# Patient Record
Sex: Male | Born: 1955
Health system: Southern US, Community
[De-identification: ages and names within clinical notes are randomized; demographics above are authoritative.]

## PROBLEM LIST (undated history)

## (undated) DIAGNOSIS — F1010 Alcohol abuse, uncomplicated: Secondary | ICD-10-CM

---

## 2001-09-17 ENCOUNTER — Emergency Department (HOSPITAL_COMMUNITY): Admission: EM | Admit: 2001-09-17 | Discharge: 2001-09-17 | Payer: Self-pay | Admitting: Emergency Medicine

## 2001-09-24 ENCOUNTER — Emergency Department (HOSPITAL_COMMUNITY): Admission: EM | Admit: 2001-09-24 | Discharge: 2001-09-24 | Payer: Self-pay | Admitting: Emergency Medicine

## 2002-10-31 ENCOUNTER — Emergency Department (HOSPITAL_COMMUNITY): Admission: EM | Admit: 2002-10-31 | Discharge: 2002-11-01 | Payer: Self-pay | Admitting: Emergency Medicine

## 2008-06-07 ENCOUNTER — Inpatient Hospital Stay (HOSPITAL_COMMUNITY): Admission: EM | Admit: 2008-06-07 | Discharge: 2008-06-08 | Payer: Self-pay | Admitting: Emergency Medicine
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
## 2011-09-01 LAB — POCT I-STAT, CHEM 8
BUN: 10
Chloride: 104
Creatinine, Ser: 1.3
Potassium: 3.9
Sodium: 141

## 2011-09-01 LAB — BASIC METABOLIC PANEL
CO2: 22
Chloride: 104
GFR calc non Af Amer: >60
Glucose, Bld: 100 — ABNORMAL HIGH
Potassium: 3.6
Sodium: 135

## 2011-09-01 LAB — CBC
HCT: 37.6 — ABNORMAL LOW
Hemoglobin: 12.6 — ABNORMAL LOW
MCV: 97.5
RDW: 13.3

## 2011-09-01 LAB — RAPID URINE DRUG SCREEN, HOSP PERFORMED
Barbiturates: NOT DETECTED
Benzodiazepines: NOT DETECTED
Cocaine: NOT DETECTED
Opiates: NOT DETECTED

## 2011-09-01 LAB — ETHANOL: Alcohol, Ethyl (B): 271 — ABNORMAL HIGH

## 2011-09-01 LAB — ABO/RH: ABO/RH(D): O POS

## 2011-09-01 LAB — PROTIME-INR: INR: 0.9

## 2013-08-28 DIAGNOSIS — F17200 Nicotine dependence, unspecified, uncomplicated: Secondary | ICD-10-CM | POA: Insufficient documentation

## 2013-08-28 DIAGNOSIS — K2900 Acute gastritis without bleeding: Secondary | ICD-10-CM | POA: Insufficient documentation

## 2013-08-29 ENCOUNTER — Encounter (HOSPITAL_COMMUNITY): Payer: Self-pay | Admitting: *Deleted

## 2013-08-29 ENCOUNTER — Emergency Department (HOSPITAL_COMMUNITY)
Admission: EM | Admit: 2013-08-29 | Discharge: 2013-08-29 | Disposition: A | Discharge status: Home / Self Care | Payer: Self-pay | Attending: Emergency Medicine | Admitting: Emergency Medicine

## 2013-08-29 DIAGNOSIS — K2970 Gastritis, unspecified, without bleeding: Secondary | ICD-10-CM

## 2013-08-29 LAB — URINALYSIS, ROUTINE W REFLEX MICROSCOPIC
Bilirubin Urine: NEGATIVE
Leukocytes, UA: NEGATIVE
Nitrite: NEGATIVE
Specific Gravity, Urine: 1.014 (ref 1.005–1.030)
Urobilinogen, UA: 0.2 mg/dL (ref 0.0–1.0)
pH: 7 (ref 5.0–8.0)

## 2013-08-29 LAB — CBC WITH DIFFERENTIAL/PLATELET
Basophils Relative: 1 % (ref 0–1)
Eosinophils Absolute: 0.1 10*3/uL (ref 0.0–0.7)
Hemoglobin: 14.5 g/dL (ref 13.0–17.0)
MCH: 33.2 pg (ref 26.0–34.0)
MCHC: 35.8 g/dL (ref 30.0–36.0)
Monocytes Absolute: 0.5 10*3/uL (ref 0.1–1.0)
Monocytes Relative: 9 % (ref 3–12)
Neutrophils Relative %: 47 % (ref 43–77)

## 2013-08-29 LAB — COMPREHENSIVE METABOLIC PANEL
Albumin: 3.9 g/dL (ref 3.5–5.2)
BUN: 12 mg/dL (ref 6–23)
Creatinine, Ser: 0.71 mg/dL (ref 0.50–1.35)
Total Protein: 7.7 g/dL (ref 6.0–8.3)

## 2013-08-29 LAB — LIPASE, BLOOD: Lipase: 17 U/L (ref 11–59)

## 2013-08-29 MED ORDER — HYDROMORPHONE HCL PF 1 MG/ML IJ SOLN
1.0000 mg | Freq: Once | INTRAMUSCULAR | Status: AC
  Administered 2013-08-29: 1 mg via INTRAVENOUS
  Filled 2013-08-29: qty 1

## 2013-08-29 MED ORDER — HYDROCODONE-ACETAMINOPHEN 5-325 MG PO TABS: 2.0000 | ORAL_TABLET | Freq: Four times a day (QID) | ORAL | Status: DC | PRN

## 2013-08-29 MED ORDER — RANITIDINE HCL 150 MG PO CAPS: 150.0000 mg | ORAL_CAPSULE | Freq: Two times a day (BID) | ORAL | Status: DC

## 2013-08-29 MED ORDER — FAMOTIDINE 20 MG PO TABS
20.0000 mg | ORAL_TABLET | Freq: Once | ORAL | Status: AC
  Administered 2013-08-29: 20 mg via ORAL
  Filled 2013-08-29: qty 1

## 2013-08-29 MED ORDER — GI COCKTAIL ~~LOC~~
30.0000 mL | Freq: Once | ORAL | Status: AC
  Administered 2013-08-29: 30 mL via ORAL
  Filled 2013-08-29: qty 30

## 2013-08-29 MED ORDER — ONDANSETRON HCL 4 MG/2ML IJ SOLN
4.0000 mg | Freq: Once | INTRAMUSCULAR | Status: AC
  Administered 2013-08-29: 4 mg via INTRAVENOUS
  Filled 2013-08-29: qty 2

## 2013-08-29 NOTE — ED Provider Notes (Signed)
CSN: [id number]     Arrival date & time 08/28/13  2359 History   First MD Initiated Contact with Patient 08/29/13 [phone]     Chief Complaint  Patient presents with  . Abdominal Pain   (Consider location/radiation/quality/duration/timing/severity/associated sxs/prior Treatment) Patient is a 57 y.o. male presenting with abdominal pain.  Abdominal Pain Pain location:  Epigastric Pain quality: sharp and stabbing   Pain radiates to:  Does not radiate Pain severity:  Severe Onset quality:  Gradual Duration:  2 days Timing:  Constant Progression:  Worsening Chronicity:  New Relieved by:  Nothing Exacerbated by: getting hungry. Ineffective treatments:  None tried Associated symptoms: nausea and vomiting   Associated symptoms: no chest pain, no cough, no diarrhea, no fever and no shortness of breath     History reviewed. No pertinent past medical history. History reviewed. No pertinent past surgical history. History reviewed. No pertinent family history. History  Substance Use Topics  . Smoking status: Current Every Day Smoker  . Smokeless tobacco: Not on file  . Alcohol Use: Yes    Review of Systems  Constitutional: Negative for fever.  HENT: Negative for congestion.   Respiratory: Negative for cough and shortness of breath.   Cardiovascular: Negative for chest pain.  Gastrointestinal: Positive for nausea, vomiting and abdominal pain. Negative for diarrhea.  All other systems reviewed and are negative.    Allergies  Review of patient's allergies indicates no known allergies.  Home Medications  No current outpatient prescriptions on file. BP 123/88  Pulse 77  Temp(Src) 98.2 F (36.8 C) (Oral)  Resp 16  SpO2 96% Physical Exam  Nursing note and vitals reviewed. Constitutional: He is oriented to person, place, and time. He appears well-developed and well-nourished. No distress.  HENT:  Head: Normocephalic and atraumatic.  Mouth/Throat: Oropharynx is clear and moist.   Eyes: Conjunctivae are normal. Pupils are equal, round, and reactive to light. No scleral icterus.  Neck: Neck supple.  Cardiovascular: Normal rate, regular rhythm, normal heart sounds and intact distal pulses.   No murmur heard. Pulmonary/Chest: Effort normal and breath sounds normal. No stridor. No respiratory distress. He has no wheezes. He has no rales.  Abdominal: Soft. He exhibits no distension. There is tenderness in the epigastric area. There is no rigidity, no rebound and no guarding.  Musculoskeletal: Normal range of motion. He exhibits no edema.  Neurological: He is alert and oriented to person, place, and time.  Skin: Skin is warm and dry. No rash noted.  Psychiatric: He has a normal mood and affect. His behavior is normal.    ED Course  Procedures (including critical care time) Labs Review Labs Reviewed  CBC WITH DIFFERENTIAL  COMPREHENSIVE METABOLIC PANEL  LIPASE, BLOOD  URINALYSIS, ROUTINE W REFLEX MICROSCOPIC   Imaging Review No results found.  MDM   1. Gastritis    57 year old male with severe epigastric pain for the past several days. Abdomen soft but tender in epigastrium. No peritoneal signs. After a GI cocktail and 1 mg IV dilauded, patient felt much better. Abdomen remained soft, now was nontender. Advised Zantac as treatment.    Candyce Churn, MD 08/29/13 [phone]

## 2013-08-29 NOTE — ED Notes (Signed)
Pt states abdominal pain pin point around umbilicus. Pt states that he has been vomiting and nauseated. Pt states that he saw small amount of blood in his vomit today.
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
## 2013-12-29 ENCOUNTER — Encounter (HOSPITAL_COMMUNITY): Payer: Self-pay | Admitting: Emergency Medicine

## 2013-12-29 ENCOUNTER — Emergency Department (HOSPITAL_COMMUNITY)
Admission: EM | Admit: 2013-12-29 | Discharge: 2013-12-29 | Disposition: A | Discharge status: Home / Self Care | Payer: Self-pay | Attending: Emergency Medicine | Admitting: Emergency Medicine

## 2013-12-29 DIAGNOSIS — Z5900 Homelessness unspecified: Secondary | ICD-10-CM | POA: Insufficient documentation

## 2013-12-29 DIAGNOSIS — F17200 Nicotine dependence, unspecified, uncomplicated: Secondary | ICD-10-CM | POA: Insufficient documentation

## 2013-12-29 DIAGNOSIS — S01501A Unspecified open wound of lip, initial encounter: Secondary | ICD-10-CM | POA: Insufficient documentation

## 2013-12-29 DIAGNOSIS — Z79899 Other long term (current) drug therapy: Secondary | ICD-10-CM | POA: Insufficient documentation

## 2013-12-29 DIAGNOSIS — S0181XA Laceration without foreign body of other part of head, initial encounter: Secondary | ICD-10-CM

## 2013-12-29 MED ORDER — IBUPROFEN 800 MG PO TABS
800.0000 mg | ORAL_TABLET | Freq: Once | ORAL | Status: AC
  Administered 2013-12-29: 800 mg via ORAL
  Filled 2013-12-29: qty 1
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
## 2013-12-29 NOTE — Discharge Instructions (Signed)
Please followup with your primary care provider or nursing care center to have your sutures removed in 7 days. You may also return to the emergency room. Return anytime if you have any increasing pain, swelling, bleeding or drainage.    Facial Laceration A facial laceration is a cut on the face. These injuries can be painful and cause bleeding. Some cuts may need to be closed with stitches (sutures), skin adhesive strips, or wound glue. Cuts usually heal quickly but can leave a scar. It can take 1 2 years for the scar to go away completely. HOME CARE   Only take medicines as told by your doctor.  Follow your doctor's instructions for wound care. For Stitches:  Keep the cut clean and dry.  If you have a bandage (dressing), change it at least once a day. Change the bandage if it gets wet or dirty, or as told by your doctor.  Wash the cut with soap and water 2 times a day. Rinse the cut with water. Pat it dry with a clean towel.  Put a thin layer of medicated cream on the cut as told by your doctor.  You may shower after the first 24 hours. Do not soak the cut in water until the stitches are removed.  Have your stitches removed as told by your doctor.  Do not wear any makeup until a few days after your stitches are removed. For Skin Adhesive Strips:  Keep the cut clean and dry.  Do not get the strips wet. You may take a bath, but be careful to keep the cut dry.  If the cut gets wet, pat it dry with a clean towel.  The strips will fall off on their own. Do not remove the strips that are still stuck to the cut. For Wound Glue:  You may shower or take baths. Do not soak or scrub the cut. Do not swim. Avoid heavy sweating until the glue falls off on its own. After a shower or bath, pat the cut dry with a clean towel.  Do not put medicine or makeup on your cut until the glue falls off.  If you have a bandage, do not put tape over the glue.  Avoid lots of sunlight or tanning lamps  until the glue falls off.  The glue will fall off on its own in 5 10 days. Do not pick at the glue. After Healing: Put sunscreen on the cut for the first year to reduce your scar. GET HELP RIGHT AWAY IF:   Your cut area gets red, painful, or puffy (swollen).  You see a yellowish-white fluid (pus) coming from the cut.  You have chills or a fever. MAKE SURE YOU:   Understand these instructions.  Will watch your condition.  Will get help right away if you are not doing well or get worse. Document Released: 05/09/2008 Document Revised: 09/11/2013 Document Reviewed: 07/04/2013 [facility] Patient Information 2014 [employer], [place].

## 2013-12-30 NOTE — ED Provider Notes (Signed)
Medical screening examination/treatment/procedure(s) were performed by non-physician practitioner and as supervising physician I was immediately available for consultation/collaboration.     Geoffery Lyonsouglas Mattie Novosel, MD 12/30/13 [phone]

## 2014-10-22 ENCOUNTER — Ambulatory Visit: Payer: Self-pay

## 2014-12-19 ENCOUNTER — Encounter (HOSPITAL_COMMUNITY): Payer: Self-pay | Admitting: Emergency Medicine

## 2014-12-19 ENCOUNTER — Emergency Department (HOSPITAL_COMMUNITY)
Admission: EM | Admit: 2014-12-19 | Discharge: 2014-12-19 | Disposition: A | Discharge status: Home / Self Care | Payer: Self-pay | Attending: Emergency Medicine | Admitting: Emergency Medicine

## 2014-12-19 ENCOUNTER — Ambulatory Visit: Payer: Self-pay | Attending: Internal Medicine

## 2014-12-19 DIAGNOSIS — R1013 Epigastric pain: Secondary | ICD-10-CM

## 2014-12-19 DIAGNOSIS — K2970 Gastritis, unspecified, without bleeding: Secondary | ICD-10-CM | POA: Insufficient documentation

## 2014-12-19 DIAGNOSIS — Z720 Tobacco use: Secondary | ICD-10-CM | POA: Insufficient documentation

## 2014-12-19 DIAGNOSIS — R112 Nausea with vomiting, unspecified: Secondary | ICD-10-CM | POA: Insufficient documentation

## 2014-12-19 LAB — CBC WITH DIFFERENTIAL/PLATELET
Basophils Absolute: 0 10*3/uL (ref 0.0–0.1)
Basophils Relative: 1 % (ref 0–1)
EOS PCT: 1 % (ref 0–5)
Eosinophils Absolute: 0.1 10*3/uL (ref 0.0–0.7)
HCT: 35.8 % — ABNORMAL LOW (ref 39.0–52.0)
Hemoglobin: 11.8 g/dL — ABNORMAL LOW (ref 13.0–17.0)
Lymphocytes Relative: 35 % (ref 12–46)
Lymphs Abs: 1.9 10*3/uL (ref 0.7–4.0)
MCH: 30.5 pg (ref 26.0–34.0)
MCHC: 33 g/dL (ref 30.0–36.0)
MCV: 92.5 fL (ref 78.0–100.0)
Monocytes Absolute: 0.5 10*3/uL (ref 0.1–1.0)
Monocytes Relative: 9 % (ref 3–12)
Neutro Abs: 3 10*3/uL (ref 1.7–7.7)
Neutrophils Relative %: 54 % (ref 43–77)
Platelets: 213 10*3/uL (ref 150–400)
RBC: 3.87 MIL/uL — ABNORMAL LOW (ref 4.22–5.81)
RDW: 12.4 % (ref 11.5–15.5)
WBC: 5.5 10*3/uL (ref 4.0–10.5)

## 2014-12-19 LAB — COMPREHENSIVE METABOLIC PANEL
ALK PHOS: 68 U/L (ref 39–117)
ALT: 12 U/L (ref 0–53)
ANION GAP: 10 (ref 5–15)
AST: 17 U/L (ref 0–37)
Albumin: 3.4 g/dL — ABNORMAL LOW (ref 3.5–5.2)
BUN: 13 mg/dL (ref 6–23)
CALCIUM: 9.2 mg/dL (ref 8.4–10.5)
CHLORIDE: 101 meq/L (ref 96–112)
CO2: 26 mmol/L (ref 19–32)
CREATININE: 0.81 mg/dL (ref 0.50–1.35)
Glucose, Bld: 107 mg/dL — ABNORMAL HIGH (ref 70–99)
Potassium: 4 mmol/L (ref 3.5–5.1)
Sodium: 137 mmol/L (ref 135–145)
TOTAL PROTEIN: 6.5 g/dL (ref 6.0–8.3)
Total Bilirubin: 0.2 mg/dL — ABNORMAL LOW (ref 0.3–1.2)

## 2014-12-19 LAB — URINALYSIS, ROUTINE W REFLEX MICROSCOPIC
BILIRUBIN URINE: NEGATIVE
Glucose, UA: NEGATIVE mg/dL
HGB URINE DIPSTICK: NEGATIVE
Ketones, ur: NEGATIVE mg/dL
Leukocytes, UA: NEGATIVE
Nitrite: NEGATIVE
PROTEIN: NEGATIVE mg/dL
Specific Gravity, Urine: 1.024 (ref 1.005–1.030)
UROBILINOGEN UA: 1 mg/dL (ref 0.0–1.0)
pH: 7 (ref 5.0–8.0)

## 2014-12-19 LAB — LIPASE, BLOOD: Lipase: 23 U/L (ref 11–59)

## 2014-12-19 MED ORDER — GI COCKTAIL ~~LOC~~
30.0000 mL | Freq: Once | ORAL | Status: AC
  Administered 2014-12-19: 30 mL via ORAL
  Filled 2014-12-19: qty 30

## 2014-12-19 MED ORDER — ONDANSETRON HCL 4 MG/2ML IJ SOLN
4.0000 mg | Freq: Once | INTRAMUSCULAR | Status: AC
  Administered 2014-12-19: 4 mg via INTRAVENOUS
  Filled 2014-12-19: qty 2

## 2014-12-19 MED ORDER — SODIUM CHLORIDE 0.9 % IV BOLUS (SEPSIS)
1000.0000 mL | Freq: Once | INTRAVENOUS | Status: AC
  Administered 2014-12-19: 1000 mL via INTRAVENOUS

## 2014-12-19 MED ORDER — ACETAMINOPHEN ER 650 MG PO TBCR: 650.0000 mg | EXTENDED_RELEASE_TABLET | Freq: Three times a day (TID) | ORAL | Status: DC | PRN

## 2014-12-19 MED ORDER — MORPHINE SULFATE 4 MG/ML IJ SOLN
4.0000 mg | Freq: Once | INTRAMUSCULAR | Status: AC
  Administered 2014-12-19: 4 mg via INTRAVENOUS
  Filled 2014-12-19: qty 1

## 2014-12-19 MED ORDER — ONDANSETRON 8 MG PO TBDP: 8.0000 mg | ORAL_TABLET | Freq: Three times a day (TID) | ORAL | Status: DC | PRN

## 2014-12-19 MED ORDER — PROMETHAZINE HCL 25 MG PO TABS: 25.0000 mg | ORAL_TABLET | Freq: Four times a day (QID) | ORAL | Status: DC | PRN

## 2014-12-19 MED ORDER — RANITIDINE HCL 150 MG PO TABS: 150.0000 mg | ORAL_TABLET | Freq: Two times a day (BID) | ORAL | Status: DC

## 2014-12-19 NOTE — ED Provider Notes (Signed)
CSN: [id number]     Arrival date & time 12/19/14  [id number] History   First MD Initiated Contact with Patient 12/19/14 [phone]     Chief Complaint  Patient presents with  . Abdominal Pain  . Nausea  . Emesis     (Consider location/radiation/quality/duration/timing/severity/associated sxs/prior Treatment) HPI Comments: Pt comes in with cc of abd pain. Pt has no medical hx, no abd surgical hx. He reports to heavy drinking in the past, but has slowed down with that in the last 2 years. Comes in with epigastric abd pain, sharp, severe, constant, non radiating - which has been present for a few days now. Pt has normal BM. Emesis x 4 times the last day, mostly comprising of food. No bloody stools or emesis.  Patient is a 59 y.o. male presenting with abdominal pain and vomiting. The history is provided by the patient.  Abdominal Pain Associated symptoms: nausea and vomiting   Associated symptoms: no chest pain, no cough, no dysuria and no shortness of breath   Emesis Associated symptoms: abdominal pain     History reviewed. No pertinent past medical history. History reviewed. No pertinent past surgical history. History reviewed. No pertinent family history. History  Substance Use Topics  . Smoking status: Current Every Day Smoker -- 1.00 packs/day  . Smokeless tobacco: Current User  . Alcohol Use: No    Review of Systems  Constitutional: Negative for activity change and appetite change.  Respiratory: Negative for cough and shortness of breath.   Cardiovascular: Negative for chest pain.  Gastrointestinal: Positive for nausea, vomiting and abdominal pain.  Genitourinary: Negative for dysuria.      Allergies  Review of patient's allergies indicates no known allergies.  Home Medications   Prior to Admission medications   Medication Sig Start Date End Date Taking? Authorizing Provider  acetaminophen (TYLENOL 8 HOUR) 650 MG CR tablet Take 1 tablet (650 mg total) by mouth every 8 (eight)  hours as needed for pain. 12/19/14   Derwood KaplanAnkit Cindel Daugherty, MD  ondansetron (ZOFRAN ODT) 8 MG disintegrating tablet Take 1 tablet (8 mg total) by mouth every 8 (eight) hours as needed for nausea. 12/19/14   Derwood KaplanAnkit Reily Treloar, MD  promethazine (PHENERGAN) 25 MG tablet Take 1 tablet (25 mg total) by mouth every 6 (six) hours as needed for nausea. 12/19/14   Derwood KaplanAnkit Evaline Waltman, MD  ranitidine (ZANTAC) 150 MG tablet Take 1 tablet (150 mg total) by mouth 2 (two) times daily. 12/19/14   Mady Oubre Rhunette CroftNanavati, MD   BP 109/67 mmHg  Pulse 74  Temp(Src) 98.1 F (36.7 C) (Oral)  Resp 17  Ht 6' (1.829 m)  Wt 160 lb (72.576 kg)  BMI 21.70 kg/m2  SpO2 96% Physical Exam  Constitutional: He is oriented to person, place, and time. He appears well-developed.  HENT:  Head: Normocephalic and atraumatic.  Eyes: Conjunctivae and EOM are normal. Pupils are equal, round, and reactive to light.  Neck: Normal range of motion. Neck supple.  Cardiovascular: Normal rate and regular rhythm.   Pulmonary/Chest: Effort normal and breath sounds normal.  Abdominal: Soft. Bowel sounds are normal. He exhibits no distension. There is tenderness. There is no rebound.  Voluntary guarding  Neurological: He is alert and oriented to person, place, and time.  Skin: Skin is warm.  Nursing note and vitals reviewed.   ED Course  Procedures (including critical care time) Labs Review Labs Reviewed  CBC WITH DIFFERENTIAL - Abnormal; Notable for the following:    RBC 3.87 (*)  Hemoglobin 11.8 (*)    HCT 35.8 (*)    All other components within normal limits  COMPREHENSIVE METABOLIC PANEL - Abnormal; Notable for the following:    Glucose, Bld 107 (*)    Albumin 3.4 (*)    Total Bilirubin 0.2 (*)    All other components within normal limits  LIPASE, BLOOD  URINALYSIS, ROUTINE W REFLEX MICROSCOPIC    Imaging Review No results found.   EKG Interpretation   Date/Time:  Friday December 19 2014 06:54:44 EST Ventricular Rate:  75 PR Interval:   123 QRS Duration: 97 QT Interval:  374 QTC Calculation: 418 R Axis:   71 Text Interpretation:  Sinus rhythm Abnormal R-wave progression, early  transition Minimal ST elevation, anterior leads Baseline wander in lead(s)  V3 No significant change since last tracing Confirmed by Roshonda Sperl, MD,  Makyle Eslick [phone]) on 12/19/2014 8:21:02 AM      MDM   Final diagnoses:  Epigastric abdominal pain  Gastritis    DDx includes: Pancreatitis Hepatobiliary pathology including cholecystitis Gastritis/PUD  Pt comes in with epigastric abd pain, with nausea and emesis. Present now for several hours, and worse with po intake. Hx of similar pain in the past, for which he was taking zantac, and also hx of alcohol abuse in the past. No abd surgery, never been diagnosed with pancreatitis.  Plan: Basic labs, pain control, nausea control. PO challenge. Imaging appears to be unlikely. Will need GI f/u.  10:08 AM No emesis in the ER. Pt is feeling a lot better. No dehydration per labs. Will d.c. Zantac and GI f/u info provided, suspect PUD, but could be other etiologies.  Derwood Kaplan, MD 12/19/14 1009

## 2014-12-19 NOTE — ED Notes (Signed)
Pt brought by EMS from home for ABD pain 10/10, nausea and vomiting, pt states he is not able to hold down any food or drink. Pt states he feels dizzy with this pain. Denies fever or chills

## 2014-12-19 NOTE — Discharge Instructions (Signed)
We saw you in the ER for the abdominal pain and nausea/vomiting. All the results in the ER are normal, labs and imaging. Seems like your symptoms improved as well. We are not sure what is causing your symptoms. The workup in the ER is not complete, and is limited to screening for life threatening and emergent conditions only, so please see a primary care doctor for further evaluation. GI doctor information also provided.   Gastritis, Adult Gastritis is soreness and swelling (inflammation) of the lining of the stomach. Gastritis can develop as a sudden onset (acute) or long-term (chronic) condition. If gastritis is not treated, it can lead to stomach bleeding and ulcers. CAUSES  Gastritis occurs when the stomach lining is weak or damaged. Digestive juices from the stomach then inflame the weakened stomach lining. The stomach lining may be weak or damaged due to viral or bacterial infections. One common bacterial infection is the Helicobacter pylori infection. Gastritis can also result from excessive alcohol consumption, taking certain medicines, or having too much acid in the stomach.  SYMPTOMS  In some cases, there are no symptoms. When symptoms are present, they may include:  Pain or a burning sensation in the upper abdomen.  Nausea.  Vomiting.  An uncomfortable feeling of fullness after eating. DIAGNOSIS  Your caregiver may suspect you have gastritis based on your symptoms and a physical exam. To determine the cause of your gastritis, your caregiver may perform the following:  Blood or stool tests to check for the H pylori bacterium.  Gastroscopy. A thin, flexible tube (endoscope) is passed down the esophagus and into the stomach. The endoscope has a light and camera on the end. Your caregiver uses the endoscope to view the inside of the stomach.  Taking a tissue sample (biopsy) from the stomach to examine under a microscope. TREATMENT  Depending on the cause of your gastritis,  medicines may be prescribed. If you have a bacterial infection, such as an H pylori infection, antibiotics may be given. If your gastritis is caused by too much acid in the stomach, H2 blockers or antacids may be given. Your caregiver may recommend that you stop taking aspirin, ibuprofen, or other nonsteroidal anti-inflammatory drugs (NSAIDs). HOME CARE INSTRUCTIONS  Only take over-the-counter or prescription medicines as directed by your caregiver.  If you were given antibiotic medicines, take them as directed. Finish them even if you start to feel better.  Drink enough fluids to keep your urine clear or pale yellow.  Avoid foods and drinks that make your symptoms worse, such as:  Caffeine or alcoholic drinks.  Chocolate.  Peppermint or mint flavorings.  Garlic and onions.  Spicy foods.  Citrus fruits, such as oranges, lemons, or limes.  Tomato-based foods such as sauce, chili, salsa, and pizza.  Fried and fatty foods.  Eat small, frequent meals instead of large meals. SEEK IMMEDIATE MEDICAL CARE IF:   You have black or dark red stools.  You vomit blood or material that looks like coffee grounds.  You are unable to keep fluids down.  Your abdominal pain gets worse.  You have a fever.  You do not feel better after 1 week.  You have any other questions or concerns. MAKE SURE YOU:  Understand these instructions.  Will watch your condition.  Will get help right away if you are not doing well or get worse. Document Released: 11/15/2001 Document Revised: 05/22/2012 Document Reviewed: 01/04/2012 [facility] Patient Information 2015 [place], [place]. This information is not intended to replace advice  given to you by your health care provider. Make sure you discuss any questions you have with your health care provider. ° °

## 2017-02-11 ENCOUNTER — Emergency Department (HOSPITAL_COMMUNITY)
Admission: EM | Admit: 2017-02-11 | Discharge: 2017-02-11 | Disposition: A | Discharge status: Home / Self Care | Payer: Medicaid Other | Attending: Emergency Medicine | Admitting: Emergency Medicine

## 2017-02-11 ENCOUNTER — Emergency Department (HOSPITAL_COMMUNITY): Payer: Medicaid Other

## 2017-02-11 ENCOUNTER — Encounter (HOSPITAL_COMMUNITY): Payer: Self-pay | Admitting: *Deleted

## 2017-02-11 DIAGNOSIS — F17200 Nicotine dependence, unspecified, uncomplicated: Secondary | ICD-10-CM | POA: Diagnosis not present

## 2017-02-11 DIAGNOSIS — J189 Pneumonia, unspecified organism: Secondary | ICD-10-CM | POA: Diagnosis not present

## 2017-02-11 DIAGNOSIS — R079 Chest pain, unspecified: Secondary | ICD-10-CM

## 2017-02-11 DIAGNOSIS — Z79899 Other long term (current) drug therapy: Secondary | ICD-10-CM | POA: Insufficient documentation

## 2017-02-11 DIAGNOSIS — R071 Chest pain on breathing: Secondary | ICD-10-CM | POA: Diagnosis present

## 2017-02-11 HISTORY — DX: Alcohol abuse, uncomplicated: F10.10

## 2017-02-11 LAB — COMPREHENSIVE METABOLIC PANEL
ALT: 19 U/L (ref 17–63)
AST: 32 U/L (ref 15–41)
Albumin: 4.2 g/dL (ref 3.5–5.0)
Alkaline Phosphatase: 55 U/L (ref 38–126)
Anion gap: 8 (ref 5–15)
BUN: 11 mg/dL (ref 6–20)
CO2: 25 mmol/L (ref 22–32)
Calcium: 9 mg/dL (ref 8.9–10.3)
Chloride: 105 mmol/L (ref 101–111)
Creatinine, Ser: 0.93 mg/dL (ref 0.61–1.24)
GFR calc Af Amer: >60 mL/min (ref >60)
GFR calc non Af Amer: >60 mL/min (ref >60)
Glucose, Bld: 80 mg/dL (ref 65–99)
Potassium: 3.9 mmol/L (ref 3.5–5.1)
Sodium: 138 mmol/L (ref 135–145)
Total Bilirubin: 1 mg/dL (ref 0.3–1.2)
Total Protein: 7.8 g/dL (ref 6.5–8.1)

## 2017-02-11 LAB — URINALYSIS, ROUTINE W REFLEX MICROSCOPIC
Bilirubin Urine: NEGATIVE
Glucose, UA: NEGATIVE mg/dL
Hgb urine dipstick: NEGATIVE
Ketones, ur: NEGATIVE mg/dL
Leukocytes, UA: NEGATIVE
Nitrite: NEGATIVE
Protein, ur: NEGATIVE mg/dL
Specific Gravity, Urine: 1.002 — ABNORMAL LOW (ref 1.005–1.030)
pH: 6 (ref 5.0–8.0)

## 2017-02-11 LAB — CBC
HCT: 38.2 % — ABNORMAL LOW (ref 39.0–52.0)
Hemoglobin: 13.1 g/dL (ref 13.0–17.0)
MCH: 31.7 pg (ref 26.0–34.0)
MCHC: 34.3 g/dL (ref 30.0–36.0)
MCV: 92.5 fL (ref 78.0–100.0)
Platelets: 237 10*3/uL (ref 150–400)
RBC: 4.13 MIL/uL — ABNORMAL LOW (ref 4.22–5.81)
RDW: 13.2 % (ref 11.5–15.5)
WBC: 5.3 10*3/uL (ref 4.0–10.5)

## 2017-02-11 LAB — TROPONIN I: Troponin I: <0.03 ng/mL (ref <0.03)

## 2017-02-11 LAB — LIPASE, BLOOD: Lipase: 22 U/L (ref 11–51)

## 2017-02-11 MED ORDER — AMOXICILLIN 500 MG PO CAPS: 500.0000 mg | ORAL_CAPSULE | Freq: Three times a day (TID) | ORAL | 0 refills | Status: DC

## 2017-02-11 MED ORDER — KETOROLAC TROMETHAMINE 15 MG/ML IJ SOLN
15.0000 mg | Freq: Once | INTRAMUSCULAR | Status: AC
Start: 2017-02-11 — End: 2017-02-11
  Administered 2017-02-11: 15 mg via INTRAVENOUS
  Filled 2017-02-11: qty 1

## 2017-02-11 MED ORDER — AZITHROMYCIN 250 MG PO TABS
500.0000 mg | ORAL_TABLET | Freq: Once | ORAL | Status: AC
  Administered 2017-02-11: 500 mg via ORAL
  Filled 2017-02-11: qty 2

## 2017-02-11 MED ORDER — LORAZEPAM 2 MG/ML IJ SOLN
1.0000 mg | Freq: Once | INTRAMUSCULAR | Status: AC
  Administered 2017-02-11: 1 mg via INTRAVENOUS
  Filled 2017-02-11: qty 1

## 2017-02-11 MED ORDER — SODIUM CHLORIDE 0.9 % IV BOLUS (SEPSIS)
1000.0000 mL | Freq: Once | INTRAVENOUS | Status: AC
  Administered 2017-02-11: 1000 mL via INTRAVENOUS

## 2017-02-11 MED ORDER — AMOXICILLIN 500 MG PO CAPS
1000.0000 mg | ORAL_CAPSULE | Freq: Once | ORAL | Status: AC
  Administered 2017-02-11: 1000 mg via ORAL
  Filled 2017-02-11: qty 2

## 2017-02-11 MED ORDER — AZITHROMYCIN 250 MG PO TABS: 250.0000 mg | ORAL_TABLET | Freq: Every day | ORAL | 0 refills | Status: DC

## 2017-02-11 NOTE — ED Triage Notes (Signed)
EMS reports pt is intoxicated and is complaining of abd and left flank pain.

## 2017-02-11 NOTE — ED Notes (Signed)
Bed: [facility]ALC Expected date:  Expected time:  Means of arrival:  Comments: 

## 2017-02-11 NOTE — ED Notes (Signed)
Bed: [id number] Expected date:  Expected time:  Means of arrival:  Comments: 61 yo flank pain-ETOH

## 2017-02-11 NOTE — ED Notes (Signed)
Pt unsteady on feet, unable to discharge due to this.

## 2017-02-11 NOTE — ED Provider Notes (Addendum)
[facility]-EMERGENCY DEPT Provider Note   CSN: [id number] Arrival date & time: 02/11/17  1621     History   Chief Complaint Chief Complaint  Patient presents with  . Abdominal Pain  . Alcohol Intoxication    HPI [name] Mosley is a 61 y.o. male.  HPI   61 year old male with chest pain. Pain is the left anterior chest along the left costal margin. Onset about 2 months ago. Initially intermittent. The past 2-3 days has been fairly constant and more severe. Worse with smoking, deep breaths and coughing. He describes it as sharp. Occasional cough productive for yellow sputum. No fevers or chills. No unusual leg pain or swelling. He has not noticed any changes with exertion. No nausea/vomiting. Has been drinking liquor today.   Past Medical History:  Diagnosis Date  . ETOH abuse     There are no active problems to display for this patient.   History reviewed. No pertinent surgical history.     Home Medications    Prior to Admission medications   Medication Sig Start Date End Date Taking? Authorizing Provider  acetaminophen (TYLENOL 8 HOUR) 650 MG CR tablet Take 1 tablet (650 mg total) by mouth every 8 (eight) hours as needed for pain. 12/19/14   Derwood Kaplan, MD  ondansetron (ZOFRAN ODT) 8 MG disintegrating tablet Take 1 tablet (8 mg total) by mouth every 8 (eight) hours as needed for nausea. 12/19/14   Derwood Kaplan, MD  promethazine (PHENERGAN) 25 MG tablet Take 1 tablet (25 mg total) by mouth every 6 (six) hours as needed for nausea. 12/19/14   Derwood Kaplan, MD  ranitidine (ZANTAC) 150 MG tablet Take 1 tablet (150 mg total) by mouth 2 (two) times daily. 12/19/14   Derwood Kaplan, MD    Family History No family history on file.  Social History Social History  Substance Use Topics  . Smoking status: Current Every Day Smoker    Packs/day: 1.00  . Smokeless tobacco: Current User  . Alcohol use No     Allergies   Patient has no known allergies.   Review of  Systems Review of Systems  All systems reviewed and negative, other than as noted in HPI.   Physical Exam Updated Vital Signs BP 140/95 (BP Location: Left Arm)   Pulse 97   Temp 98.3 F (36.8 C) (Oral)   Resp 18   SpO2 97%   Physical Exam  Constitutional: He appears well-developed and well-nourished. No distress.  HENT:  Head: Normocephalic and atraumatic.  Eyes: Conjunctivae are normal. Right eye exhibits no discharge. Left eye exhibits no discharge.  Neck: Neck supple.  Cardiovascular: Normal rate, regular rhythm and normal heart sounds.  Exam reveals no gallop and no friction rub.   No murmur heard. Pulmonary/Chest: Effort normal. No respiratory distress. He has wheezes.  Abdominal: Soft. He exhibits no distension. There is tenderness. There is no rebound and no guarding.  Mild epigastric tenderness.   Musculoskeletal: He exhibits no edema or tenderness.  Neurological: He is alert.  Skin: Skin is warm and dry.  Psychiatric: He has a normal mood and affect. His behavior is normal. Thought content normal.  Nursing note and vitals reviewed.    ED Treatments / Results  Labs (all labs ordered are listed, but only abnormal results are displayed) Labs Reviewed  CBC - Abnormal; Notable for the following:       Result Value   RBC 4.13 (*)    HCT 38.2 (*)    All  other components within normal limits  URINALYSIS, ROUTINE W REFLEX MICROSCOPIC - Abnormal; Notable for the following:    Color, Urine COLORLESS (*)    Specific Gravity, Urine 1.002 (*)    All other components within normal limits  LIPASE, BLOOD  COMPREHENSIVE METABOLIC PANEL  TROPONIN I    EKG  EKG Interpretation None       Radiology No results found.   Dg Chest 2 View  Result Date: 02/11/2017 CLINICAL DATA:  Left-sided chest pain and cough. EXAM: CHEST  2 VIEW COMPARISON:  One-view chest x-ray a 06/07/2008 FINDINGS: Heart is enlarged. There is elevation of left hemidiaphragm. Mild left basilar  airspace opacity likely reflects atelectasis. There is no significant edema or effusion. Degenerative changes are present within the thoracic spine. IMPRESSION: 1. Increased chronic elevation of the left hemidiaphragm. 2. Asymmetric left basilar airspace disease likely reflects atelectasis. Infection is not excluded. Electronically Signed   By: Marin Robertshristopher  Mattern M.D.   On: 02/11/2017 17:37    Procedures Procedures (including critical care time)  Medications Ordered in ED Medications - No data to display   Initial Impression / Assessment and Plan / ED Course  I have reviewed the triage vital signs and the nursing notes.  Pertinent labs & imaging results that were available during my care of the patient were reviewed by me and considered in my medical decision making (see chart for details).     61 year old male with chest pain. Seems atypical for ACS. Pleuritic in nature. Ongoing for several months constant for the past 2-3 days. No similar change with exertion. He is afebrile. Is not tachycardic. He is not hypoxic. Mild rhonchorous breath sounds bilaterally. He does not sound focally worse on his left side. May be related to his long-standing smoking history.  Chest x-ray with infiltrate versus atelectasis at the left base. Not clinically convinced this is pneumonia but he is complaining of pleuritic pain on his left side. Will treat for possible community-acquired pneumonia. He is afebrile. No increased work of breathing. I feel he is appropriate outpatient treatment.  Final Clinical Impressions(s) / ED Diagnoses   Final diagnoses:  Left sided chest pain  Community acquired pneumonia of left lung, unspecified part of lung    New Prescriptions New Prescriptions   No medications on file     Raeford RazorStephen Jerrell Hart, MD 02/21/17 1442    Raeford RazorStephen Aracely Rickett, MD 02/27/17 1630

## 2017-02-11 NOTE — ED Notes (Signed)
Cab called at pts request to take home wife/s.o. aware pt is coming home

## 2018-05-25 IMAGING — CR DG CHEST 2V
2 series · 2 of 2 positions shown · non-contrast
Comparison: One-view chest x-ray a 06/07/2008

CLINICAL DATA: Left-sided chest pain and cough.

EXAM:
CHEST  2 VIEW

[w chest lat]
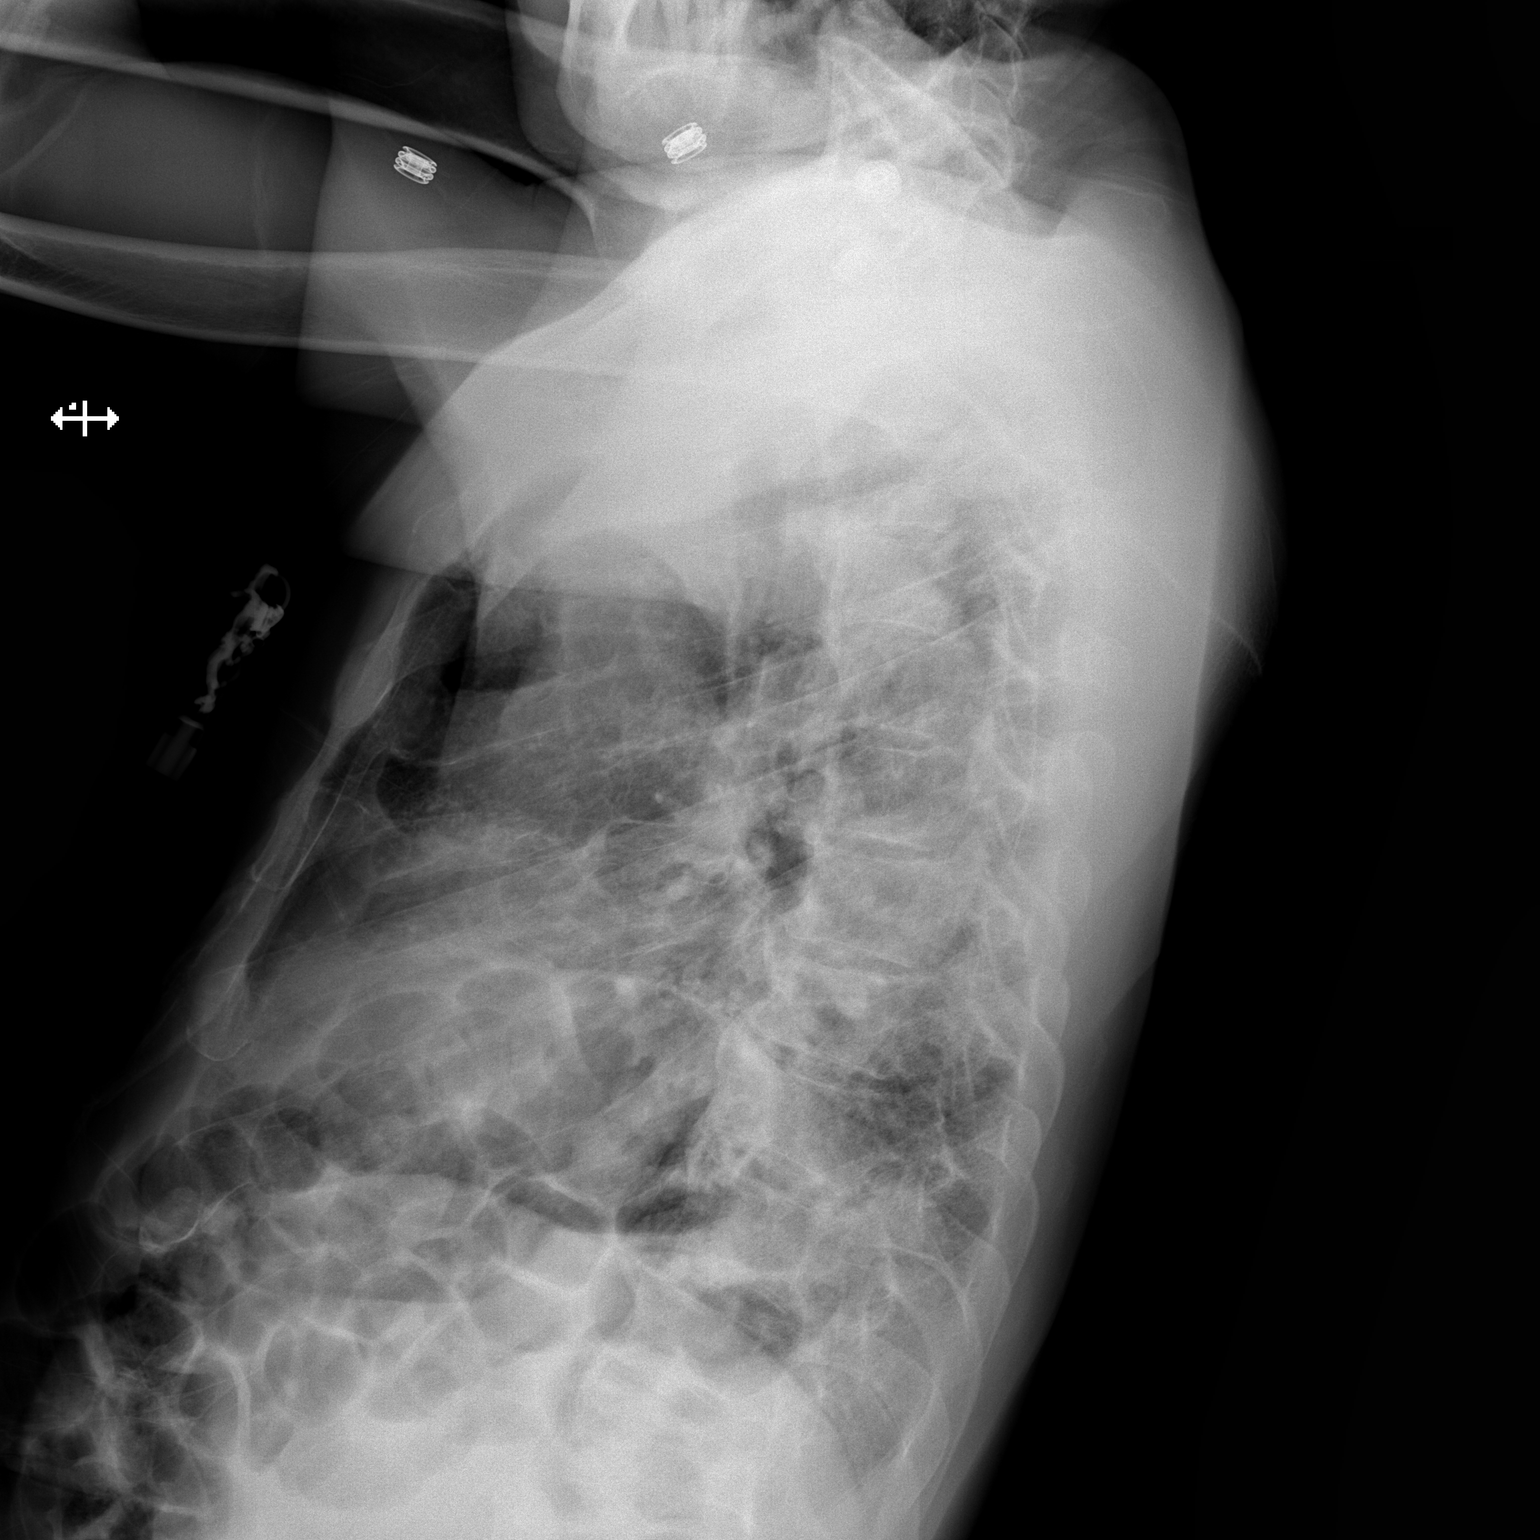

[x chest ap]
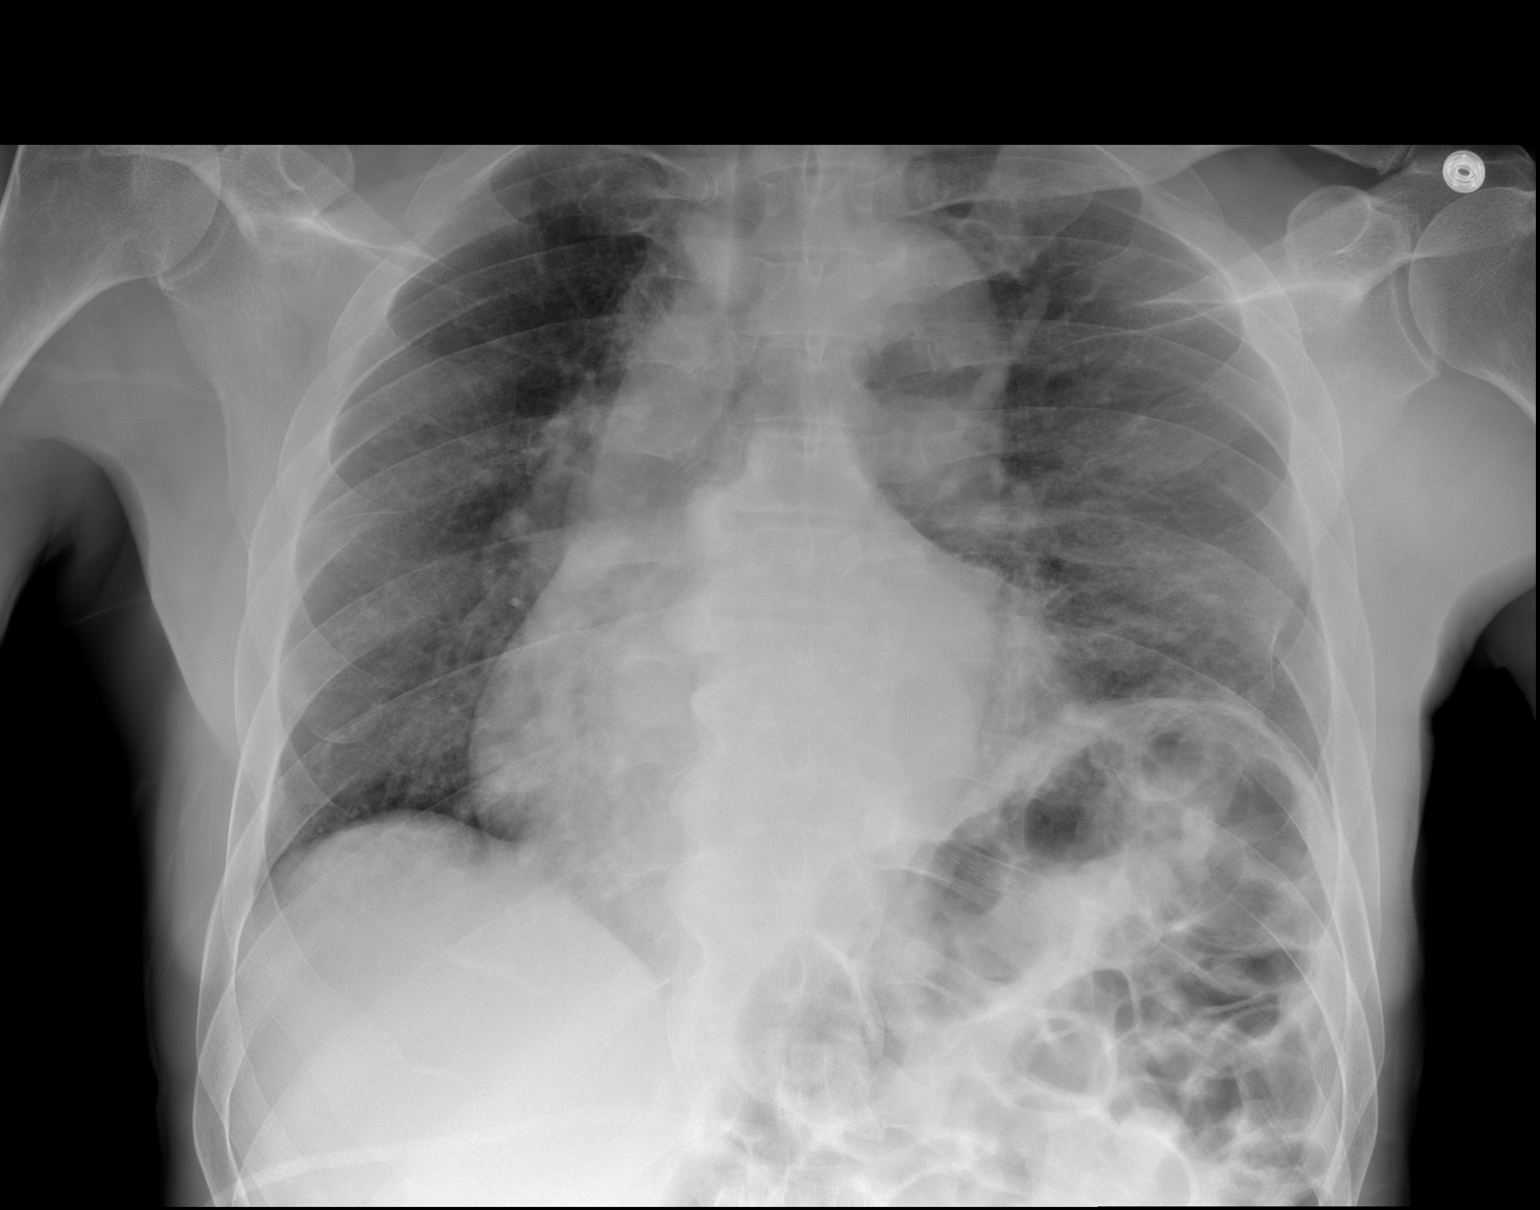

[2 of 2 positions shown; findings below may reference images not displayed]

FINDINGS: Heart is enlarged. There is elevation of left hemidiaphragm. Mild
left basilar airspace opacity likely reflects atelectasis. There is
no significant edema or effusion. Degenerative changes are present
within the thoracic spine.
IMPRESSION: 1. Increased chronic elevation of the left hemidiaphragm.
2. Asymmetric left basilar airspace disease likely reflects
atelectasis. Infection is not excluded.

## 2019-05-15 ENCOUNTER — Other Ambulatory Visit: Payer: Self-pay

## 2019-05-15 DIAGNOSIS — F17210 Nicotine dependence, cigarettes, uncomplicated: Secondary | ICD-10-CM | POA: Insufficient documentation

## 2019-05-15 DIAGNOSIS — F17228 Nicotine dependence, chewing tobacco, with other nicotine-induced disorders: Secondary | ICD-10-CM | POA: Insufficient documentation

## 2019-05-15 DIAGNOSIS — F10920 Alcohol use, unspecified with intoxication, uncomplicated: Secondary | ICD-10-CM | POA: Insufficient documentation

## 2019-05-15 DIAGNOSIS — Z79899 Other long term (current) drug therapy: Secondary | ICD-10-CM | POA: Insufficient documentation

## 2019-05-15 DIAGNOSIS — H259 Unspecified age-related cataract: Secondary | ICD-10-CM | POA: Diagnosis present

## 2019-05-15 NOTE — ED Triage Notes (Signed)
Patient states he needs to see the eye doctor because his cataract has gotten worse. He states he is danger himself. Patient states he can not see. Patient has ETOH on board.

## 2019-05-16 ENCOUNTER — Encounter (HOSPITAL_COMMUNITY): Payer: Self-pay | Admitting: Emergency Medicine

## 2019-05-16 ENCOUNTER — Emergency Department (HOSPITAL_COMMUNITY)
Admission: EM | Admit: 2019-05-16 | Discharge: 2019-05-16 | Disposition: A | Discharge status: Home / Self Care | Payer: Medicare Other | Attending: Emergency Medicine | Admitting: Emergency Medicine

## 2019-05-16 ENCOUNTER — Other Ambulatory Visit: Payer: Self-pay

## 2019-05-16 DIAGNOSIS — H259 Unspecified age-related cataract: Secondary | ICD-10-CM

## 2019-05-16 DIAGNOSIS — F10920 Alcohol use, unspecified with intoxication, uncomplicated: Secondary | ICD-10-CM

## 2019-05-16 NOTE — ED Provider Notes (Signed)
[facility] DEPT Provider Note: Georgena Spurling, MD, FACEP  CSN: [id number] MRN: [id number] ARRIVAL: 05/15/19 at [place]: WA09/WA09   CHIEF COMPLAINT  Cataracts   HISTORY OF PRESENT ILLNESS  05/16/19 12:26 AM [name] Mosley is a 63 y.o. male with a history of alcoholism.  He states "I drink all the time".  He is here because of cataracts.  He states he was diagnosed with cataracts, primarily on the right, during a public free screening last year.  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  He does not have a primary care physician nor has he been referred to an ophthalmologist.   Past Medical History:  Diagnosis Date  . ETOH abuse     History reviewed. No pertinent surgical history.  History reviewed. No pertinent family history.  Social History   Tobacco Use  . Smoking status: Current Every Day Smoker    Packs/day: 1.00  . Smokeless tobacco: Current User  Substance Use Topics  . Alcohol use: No  . Drug use: No    Prior to Admission medications   Medication Sig Start Date End Date Taking? Authorizing Provider  acetaminophen (TYLENOL 8 HOUR) 650 MG CR tablet Take 1 tablet (650 mg total) by mouth every 8 (eight) hours as needed for pain. 12/19/14   Varney Biles, MD  amoxicillin (AMOXIL) 500 MG capsule Take 1 capsule (500 mg total) by mouth 3 (three) times daily. 02/11/17   Virgel Manifold, MD  azithromycin (ZITHROMAX Z-PAK) 250 MG tablet Take 1 tablet (250 mg total) by mouth daily. 1 pill daily for 4 days. 02/11/17   Virgel Manifold, MD  ondansetron (ZOFRAN ODT) 8 MG disintegrating tablet Take 1 tablet (8 mg total) by mouth every 8 (eight) hours as needed for nausea. 12/19/14   Varney Biles, MD  promethazine (PHENERGAN) 25 MG tablet Take 1 tablet (25 mg total) by mouth every 6 (six) hours as needed for nausea. 12/19/14   Varney Biles, MD  ranitidine (ZANTAC) 150 MG tablet Take 1 tablet (150 mg total) by mouth 2 (two) times  daily. 12/19/14   Varney Biles, MD    Allergies Patient has no known allergies.   REVIEW OF SYSTEMS  Negative except as noted here or in the History of Present Illness.   PHYSICAL EXAMINATION  Initial Vital Signs Blood pressure 116/77, pulse (!) 101, temperature 99.1 F (37.3 C), temperature source Oral, resp. rate 18, height 6' (1.829 m), weight 74.8 kg, SpO2 95 %.  Examination General: Well-developed, well-nourished male in no acute distress; appearance consistent with age of record HENT: normocephalic; atraumatic Eyes: pupils equal, round and sluggish; extraocular muscles intact; cataracts, right greater than left Neck: supple Heart: regular rate and rhythm Lungs: clear to auscultation bilaterally Abdomen: soft; nondistended; nontender; no masses or hepatosplenomegaly; bowel sounds present Extremities: No deformity; full range of motion; pulses normal Neurologic: Awake, alert; motor function intact in all extremities and symmetric; no facial droop Skin: Warm and dry Psychiatric: Mildly intoxicated but pleasant and cooperative   RESULTS  Summary of this visit's results, reviewed by myself:   EKG Interpretation  Date/Time:    Ventricular Rate:    PR Interval:    QRS Duration:   QT Interval:    QTC Calculation:   R Axis:     Text Interpretation:        Laboratory Studies: No results found for this or any previous visit (from the past 24 hour(s)).  Imaging Studies: No results found.  ED COURSE and MDM  Nursing notes and initial vitals signs, including pulse oximetry, reviewed.  Vitals:   05/16/19 0000 05/16/19 0020  BP:  116/77  Pulse:  (!) 101  Resp:  18  Temp:  99.1 F (37.3 C)  TempSrc:  Oral  SpO2:  95%  Weight: 74.8 kg   Height: 6' (1.829 m)    The patient's cataracts appear to be chronic and he is able to see with his left eye.  He is intoxicated but able to ambulate and converse without difficulty.  I see no need for hospitalization or further  work-up.  He does need referrals to primary care and ophthalmology.  PROCEDURES    ED DIAGNOSES     ICD-10-CM   1. Alcoholic intoxication without complication (HCC) F10.920   2. Age-related cataract of both eyes, unspecified age-related cataract type H25.9        Okie Jansson, MD 05/16/19 [phone]

## 2019-05-16 NOTE — ED Notes (Signed)
Pt verbalized discharge instructions and follow up care. Alert and ambulatory. No iv.  

## 2019-05-16 NOTE — ED Notes (Signed)
Bed: WA09 Expected date:  Expected time:  Means of arrival:  Comments: 

## 2020-04-20 DIAGNOSIS — R69 Illness, unspecified: Secondary | ICD-10-CM | POA: Diagnosis not present

## 2020-06-02 ENCOUNTER — Ambulatory Visit: Payer: Self-pay

## 2020-06-02 NOTE — Telephone Encounter (Signed)
Patient called and says for the past 2 weeks he's been having SOB, feeling dizzy when bending over, coughing up yellow mucus. He says he smokes and has not smoked due to the SOB. He says the SOB comes and goes, worse with exertion. He says he's not able to reach anyone at his PCP office. I advised to go to the UC. [facility] addresses given in [place], care advice, he verbalized understanding.   Reason for Disposition . [1] MILD difficulty breathing (e.g., minimal/no SOB at rest, SOB with walking, pulse <100) AND [2] NEW-onset or WORSE than normal  Answer Assessment - Initial Assessment Questions 1. RESPIRATORY STATUS: "Describe your breathing?" (e.g., wheezing, shortness of breath, unable to speak, severe coughing)      Shortness of breath with wheezing 2. ONSET: "When did this breathing problem begin?"      1 week ago 3. PATTERN "Does the difficult breathing come and go, or has it been constant since it started?"      Comes and goes 4. SEVERITY: "How bad is your breathing?" (e.g., mild, moderate, severe)    - MILD: No SOB at rest, mild SOB with walking, speaks normally in sentences, can lay down, no retractions, pulse < 100.    - MODERATE: SOB at rest, SOB with minimal exertion and prefers to sit, cannot lie down flat, speaks in phrases, mild retractions, audible wheezing, pulse 100-120.    - SEVERE: Very SOB at rest, speaks in single words, struggling to breathe, sitting hunched forward, retractions, pulse > 120      Mild to moderate 5. RECURRENT SYMPTOM: "Have you had difficulty breathing before?" If Yes, ask: "When was the last time?" and "What happened that time?"      No 6. CARDIAC HISTORY: "Do you have any history of heart disease?" (e.g., heart attack, angina, bypass surgery, angioplasty)      No 7. LUNG HISTORY: "Do you have any history of lung disease?"  (e.g., pulmonary embolus, asthma, emphysema)     No 8. CAUSE: "What do you think is causing the breathing problem?"       Smoking 9. OTHER SYMPTOMS: "Do you have any other symptoms? (e.g., dizziness, runny nose, cough, chest pain, fever)     Dizzy when bend over, walking a long distance, cough with yellow since 2 weeks 10. PREGNANCY: "Is there any chance you are pregnant?" "When was your last menstrual period?"       N/A 11. TRAVEL: "Have you traveled out of the country in the last month?" (e.g., travel history, exposures)       No  Protocols used: BREATHING DIFFICULTY-A-AH

## 2020-06-10 ENCOUNTER — Encounter (HOSPITAL_COMMUNITY): Payer: Self-pay

## 2020-06-10 ENCOUNTER — Emergency Department (HOSPITAL_COMMUNITY)
Admission: EM | Admit: 2020-06-10 | Discharge: 2020-07-05 | Disposition: E | Payer: Medicare HMO | Attending: Emergency Medicine | Admitting: Emergency Medicine

## 2020-06-10 DIAGNOSIS — R0689 Other abnormalities of breathing: Secondary | ICD-10-CM | POA: Diagnosis not present

## 2020-06-10 DIAGNOSIS — I499 Cardiac arrhythmia, unspecified: Secondary | ICD-10-CM | POA: Diagnosis not present

## 2020-06-10 DIAGNOSIS — R0902 Hypoxemia: Secondary | ICD-10-CM | POA: Diagnosis not present

## 2020-06-10 DIAGNOSIS — R0602 Shortness of breath: Secondary | ICD-10-CM | POA: Diagnosis not present

## 2020-06-10 DIAGNOSIS — I213 ST elevation (STEMI) myocardial infarction of unspecified site: Secondary | ICD-10-CM | POA: Diagnosis not present

## 2020-06-10 DIAGNOSIS — I469 Cardiac arrest, cause unspecified: Secondary | ICD-10-CM | POA: Insufficient documentation

## 2020-06-10 MED ORDER — SODIUM CHLORIDE 0.9 % IV SOLN
INTRAVENOUS | Status: AC | PRN
  Administered 2020-06-10: 1000 mL via INTRAVENOUS

## 2020-06-10 MED ORDER — EPINEPHRINE 1 MG/10ML IJ SOSY
PREFILLED_SYRINGE | INTRAMUSCULAR | Status: AC | PRN
  Administered 2020-06-10 (×3): 0.1 mg via INTRAVENOUS

## 2020-06-10 MED FILL — Medication: Qty: 1 | Status: AC

## 2020-07-05 DIAGNOSIS — 419620001 Death: Secondary | SNOMED CT | POA: Diagnosis not present

## 2020-07-05 NOTE — ED Provider Notes (Signed)
[facility] EMERGENCY DEPARTMENT Provider Note   CSN: [id number] Arrival date & time: 2020-06-29  1115     History Chief Complaint  Patient presents with  . Cardiac Arrest    [name] Mosley is a 64 y.o. male.  HPI   64 year old male brought in by EMS with CPR in progress.  They were initially called to a gas station where patient was present and complaining of shortness of breath.  Initially he was alert and talking but seemed uncomfortable.  Initial EKG with questionable STEMI.  In route he became unresponsive.  CPR initiated at 10:52 AM.  PEA.  Received multiple rounds of epinephrine prior to arrival.  On arrival to the emergency room CPR was still in progress.  Patient had a GCS of 3T.  King airway was placed prior to arrival.  Patient admitted drinking alcohol to them.  Per review of records, he has a history of alcohol abuse.  Reportedly bystanders were familiar with him and reported that he was not acting like his normal self prior to them being called.  Past Medical History:  Diagnosis Date  . ETOH abuse    There are no problems to display for this patient.  No past surgical history on file.    No family history on file.  Social History   Tobacco Use  . Smoking status: Current Every Day Smoker    Packs/day: 1.00  . Smokeless tobacco: Current User  Substance Use Topics  . Alcohol use: No  . Drug use: No   Home Medications Prior to Admission medications   Not on File   Allergies    Patient has no known allergies.  Review of Systems   Review of Systems Level 5 caveat obvious patient is unresponsive. Physical Exam Updated Vital Signs There were no vitals taken for this visit.  Physical Exam Vitals and nursing note reviewed.  Constitutional:      General: He is in acute distress.     Appearance: He is well-developed. He is ill-appearing and toxic-appearing.  HENT:     Head: Normocephalic and atraumatic.  Eyes:     General:        Right  eye: No discharge.        Left eye: No discharge.     Comments: Pupils 75mm, symmetric. Nonreactive.   Cardiovascular:     Comments: No pulse. PEA on monitor. Thoracotomy scar noted.  Pulmonary:     Comments: King airway in place. Bagged easily. Symmetric breath sounds.  Abdominal:     General: There is no distension.     Palpations: Abdomen is soft.     Tenderness: There is no abdominal tenderness.  Musculoskeletal:        General: No tenderness.     Cervical back: Neck supple.  Skin:    General: Skin is warm and dry.  Neurological:     Comments: GCS 3T.      ED Results / Procedures / Treatments   Labs (all labs ordered are listed, but only abnormal results are displayed) Labs Reviewed - No data to display  EKG None  Radiology No results found.  Procedures Procedures (including critical care time)  Cardiopulmonary Resuscitation (CPR) Procedure Note Directed/Performed by: Raeford Razor I personally directed ancillary staff and/or performed CPR in an effort to regain return of spontaneous circulation and to maintain cardiac, neuro and systemic perfusion.   INTUBATION Performed by: Raeford Razor  Required items: required blood products, implants, devices, and special  equipment available Patient identity confirmed: provided demographic data and hospital-assigned identification number Time out: Immediately prior to procedure a "time out" was called to verify the correct patient, procedure, equipment, support staff and site/side marked as required.  Indications: respiratory failure, airway protection  Intubation method: Glidescope Laryngoscopy   Preoxygenation: BVM via Brooke Dare Airway  Sedatives: none Paralytic: none  Tube Size: 7.5 cuffed  Post-procedure assessment: chest rise and ETCO2 monitor Breath sounds: equal and absent over the epigastrium Tube secured with: ETT holder Chest x-ray interpreted by radiologist and me.  Chest x-ray findings: Not done. Never  became stable to obtain imaging.   Patient tolerated the procedure well with no immediate complications.   Medications Ordered in ED Medications - No data to display  ED Course  I have reviewed the triage vital signs and the nursing notes.  Pertinent labs & imaging results that were available during my care of the patient were reviewed by me and considered in my medical decision making (see chart for details).    MDM Rules/Calculators/A&P                          64 year old male with witnessed cardiac arrest.  Questionable STEMI on EKG.  Poor tracing.  Patient had CPR for approximately 30 minutes total.  He remained in PEA the whole time.  His King airway was exchanged for an ET tube.  After 30 minutes of efforts, CPR was discontinued.  The chance of him having a meaningful neurologic outcome at that point was dismal. Time of death 11:20 am.   Final Clinical Impression(s) / ED Diagnoses Final diagnoses:  Cardiac arrest [facility])    Rx / DC Orders ED Discharge Orders    None       Raeford Razor, MD 2020-06-21 1129

## 2020-07-05 NOTE — Progress Notes (Signed)
Responded to ED page to support family and staff.  Patient deceased. I gave belongings to [employer], pt's neice. Jeans, black socks, black/white shoes, belt, wallet with cards, 2 necklaces, 1 ring, box cutter, keys attached to a chain, shirt cut and remains with pt . I made Verlon Au the pt.'s nurse aware that niece has belonging.   Venida Jarvis, [place], [facility], Pager [phone]

## 2020-07-05 NOTE — ED Triage Notes (Signed)
Per [facility] pt was on the side of the road, bystanders called saying pt did not look like his normal self, unsure if pt is homeless. Pt c/o SOB with EMS, kept saying "I cant breathe." pt given duo neb, EKG sent in showing criteria for MI, pt went unresponsive in a PEA rhythm with EMS, chest compressions started, Epi x3 given, PEA on arrival to ED. Pt endorses to EMS he drank Alcohol, unclear if any drug use this am. Wheezing noted to Right lungs throughout   IO Left Tib

## 2020-07-05 NOTE — ED Notes (Addendum)
Belongings have been returned to [employer], pt's neice. Jeans, black socks, black/white shoes, belt, wallet with cards, 2 necklaces, 1 ring, box cutter, keys attached to a chain, shirt cut and remains with pt.  Sheran Luz, pts niece, [phone] [address] [place] [address] [place] [address]

## 2020-07-05 NOTE — ED Notes (Signed)
johnna boyer- niece- would like a pt update- [phone]

## 2020-07-05 DEATH — deceased
